# Patient Record
Sex: Female | Born: 1955 | Hispanic: No | Marital: Single | Smoking: Never smoker
Health system: Southern US, Community
[De-identification: ages and names within clinical notes are randomized; demographics above are authoritative.]

## PROBLEM LIST (undated history)

## (undated) DIAGNOSIS — K760 Fatty (change of) liver, not elsewhere classified: Secondary | ICD-10-CM

## (undated) DIAGNOSIS — M1990 Unspecified osteoarthritis, unspecified site: Secondary | ICD-10-CM

## (undated) DIAGNOSIS — F32A Depression, unspecified: Secondary | ICD-10-CM

## (undated) DIAGNOSIS — F419 Anxiety disorder, unspecified: Secondary | ICD-10-CM

## (undated) DIAGNOSIS — R918 Other nonspecific abnormal finding of lung field: Secondary | ICD-10-CM

## (undated) HISTORY — PX: FRACTURE SURGERY: SHX138

## (undated) HISTORY — PX: ABDOMINAL HYSTERECTOMY: SHX81

---

## 2020-09-24 ENCOUNTER — Other Ambulatory Visit: Payer: Self-pay | Admitting: Neurosurgery

## 2020-10-18 ENCOUNTER — Encounter (HOSPITAL_COMMUNITY): Payer: Self-pay

## 2020-10-18 ENCOUNTER — Other Ambulatory Visit: Payer: Self-pay

## 2020-10-18 ENCOUNTER — Encounter (HOSPITAL_COMMUNITY)
Admission: RE | Admit: 2020-10-18 | Discharge: 2020-10-18 | Disposition: A | Discharge status: Home / Self Care | Payer: Medicare Other | Source: Ambulatory Visit | Attending: Neurosurgery | Admitting: Neurosurgery

## 2020-10-18 ENCOUNTER — Other Ambulatory Visit (HOSPITAL_COMMUNITY)
Admission: RE | Admit: 2020-10-18 | Discharge: 2020-10-18 | Disposition: A | Discharge status: Home / Self Care | Payer: Medicare Other | Source: Ambulatory Visit | Attending: Neurosurgery | Admitting: Neurosurgery

## 2020-10-18 DIAGNOSIS — Z01812 Encounter for preprocedural laboratory examination: Secondary | ICD-10-CM | POA: Diagnosis present

## 2020-10-18 DIAGNOSIS — Z20822 Contact with and (suspected) exposure to covid-19: Secondary | ICD-10-CM | POA: Insufficient documentation

## 2020-10-18 HISTORY — DX: Unspecified osteoarthritis, unspecified site: M19.90

## 2020-10-18 HISTORY — DX: Anxiety disorder, unspecified: F41.9

## 2020-10-18 HISTORY — DX: Fatty (change of) liver, not elsewhere classified: K76.0

## 2020-10-18 HISTORY — DX: Depression, unspecified: F32.A

## 2020-10-18 HISTORY — DX: Other nonspecific abnormal finding of lung field: R91.8

## 2020-10-18 LAB — COMPREHENSIVE METABOLIC PANEL
ALT: 13 U/L (ref 0–44)
AST: 20 U/L (ref 15–41)
Albumin: 3.9 g/dL (ref 3.5–5.0)
Alkaline Phosphatase: 66 U/L (ref 38–126)
Anion gap: 8 (ref 5–15)
BUN: 10 mg/dL (ref 8–23)
CO2: 27 mmol/L (ref 22–32)
Calcium: 9.6 mg/dL (ref 8.9–10.3)
Chloride: 106 mmol/L (ref 98–111)
Creatinine, Ser: 0.74 mg/dL (ref 0.44–1.00)
GFR, Estimated: >60 mL/min (ref >60)
Glucose, Bld: 94 mg/dL (ref 70–99)
Potassium: 4.4 mmol/L (ref 3.5–5.1)
Sodium: 141 mmol/L (ref 135–145)
Total Bilirubin: 1.2 mg/dL (ref 0.3–1.2)
Total Protein: 6.2 g/dL — ABNORMAL LOW (ref 6.5–8.1)

## 2020-10-18 LAB — CBC
HCT: 40.2 % (ref 36.0–46.0)
Hemoglobin: 13.8 g/dL (ref 12.0–15.0)
MCH: 31.4 pg (ref 26.0–34.0)
MCHC: 34.3 g/dL (ref 30.0–36.0)
MCV: 91.6 fL (ref 80.0–100.0)
Platelets: 222 10*3/uL (ref 150–400)
RBC: 4.39 MIL/uL (ref 3.87–5.11)
RDW: 12.1 % (ref 11.5–15.5)
WBC: 5.1 10*3/uL (ref 4.0–10.5)
nRBC: 0 % (ref 0.0–0.2)

## 2020-10-18 LAB — TYPE AND SCREEN
ABO/RH(D): B POS
Antibody Screen: NEGATIVE

## 2020-10-18 LAB — SURGICAL PCR SCREEN
MRSA, PCR: NEGATIVE
Staphylococcus aureus: NEGATIVE

## 2020-10-18 LAB — SARS CORONAVIRUS 2 (TAT 6-24 HRS): SARS Coronavirus 2: NEGATIVE

## 2020-10-18 NOTE — Pre-Procedure Instructions (Signed)
Emily Gallagher  10/18/2020      Your procedure is scheduled on Monday, March 7.  Report to [facility], Main Entrance or Entrance "A" at 5:30 AM.                Your surgery or procedure is scheduled to begin at 7:30 AM   Call this number if you have problems the morning of surgery: [phone]  This is the number for the Pre- Surgical Desk.                For any other questions, please call [phone], Monday - Friday 8 AM - 4 PM.   Remember:  Do not eat or drink after midnight Sunday, March 6    Take these medicines the morning of surgery with A SIP OF WATER : DULoxetine (CYMBALTA)  estradiol (ESTRACE) gabapentin (NEURONTIN)  simvastatin (ZOCOR)  If needed: fluticasone (FLONASE)  nasal spray    STOP taking Aspirin, Aspirin Products (Goody Powder, Excedrin Migraine), Ibuprofen (Advil), Naproxen (Aleve), Vitamins and Herbal Products (ie Fish Oil), Pseudoephedrine-Ibuprofen (ADVIL COLD/SINUS).   Special instructions:  If you smoke , DO NOT smoke -within  24 hours prior to surgery.  If you have a CPAP, please bring the mask with you. - Preparing For Surgery  Before surgery, you can play an important role. Because skin is not sterile, your skin needs to be as free of germs as possible. You can reduce the number of germs on your skin by washing with CHG (chlorahexidine gluconate) Soap before surgery.  CHG is an antiseptic cleaner which kills germs and bonds with the skin to continue killing germs even after washing.    Oral Hygiene is also important to reduce your risk of infection.  Remember - BRUSH YOUR TEETH THE MORNING OF SURGERY WITH YOUR REGULAR TOOTHPASTE  Please do not use if you have an allergy to CHG or antibacterial soaps. If your skin becomes reddened/irritated stop using the CHG.  Do not shave (including legs and underarms) for at least 48 hours prior to first CHG shower. It is OK to shave your face.  Please follow these instructions  carefully.   1. Shower the NIGHT BEFORE SURGERY and the MORNING OF SURGERY with CHG.   2. If you chose to wash your hair, wash your hair first as usual with your normal shampoo.  3. After you shampoo, wash your face and private area with the soap you use at home, then rinse your hair and body thoroughly to remove the shampoo and soap.  4. Use CHG as you would any other liquid soap. You can apply CHG directly to the skin and wash gently with a scrungie or a clean washcloth.   5. Apply the CHG Soap to your body ONLY FROM THE NECK DOWN.  Do not use on open wounds or open sores. Avoid contact with your eyes, ears, mouth and genitals (private parts).   6. Wash thoroughly, paying special attention to the area where your surgery will be performed.  7. Thoroughly rinse your body with warm water from the neck down.  8. DO NOT shower/wash with your normal soap after using and rinsing off the CHG Soap.  9. Pat yourself dry with a CLEAN TOWEL.  10. Wear CLEAN PAJAMAS to bed the night before surgery, wear comfortable clothes the morning of surgery  11. Place CLEAN SHEETS on your bed the night of your first shower and DO NOT SLEEP WITH PETS.  Day of Surgery: Shower as instructed above. Do not apply any deodorants/lotions, powders or colognes.  Please wear clean clothes to the hospital/surgery center.   Remember to brush your teeth WITH YOUR REGULAR TOOTHPASTE.  Do not wear jewelry, make-up or nail polish.  Do not shave 48 hours prior to surgery.  Men may shave face and neck.  Do not bring valuables to the hospital.  [facility] is not responsible for any belongings or valuables.  Contacts, dentures or bridgework may not be worn into surgery.  Leave your suitcase in the car.  After surgery it may be brought to your room.  For patients admitted to the hospital, discharge time will be determined by your treatment team.  Patients discharged the day of surgery will not be allowed to drive home.    Please read over the fact sheets that you were given.

## 2020-10-18 NOTE — Progress Notes (Signed)
PCP: Lorelei Pont, MD Pulmonologist: Oda Cogan Cardiologist: Denies  EKG:na CXR: na ECHO: denies Stress Test: denies Cardiac Cath: denies  Fasting Blood Sugar- na Checks Blood Sugar_na__ times a day  ASA/Blood Thinners:  No  OSA/CPAP:  No  Covid test 10/18/20  Anesthesia Review:  No   Patient denies shortness of breath, fever, cough, and chest pain at PAT appointment.  Patient verbalized understanding of instructions provided today at the PAT appointment.  Patient asked to review instructions at home and day of surgery.

## 2020-10-18 NOTE — Progress Notes (Signed)
Sent Dr. Wynetta Emery a staff message

## 2020-10-22 ENCOUNTER — Inpatient Hospital Stay (HOSPITAL_COMMUNITY): Payer: Medicare Other

## 2020-10-22 ENCOUNTER — Encounter (HOSPITAL_COMMUNITY): Payer: Self-pay | Admitting: Neurosurgery

## 2020-10-22 ENCOUNTER — Inpatient Hospital Stay (HOSPITAL_COMMUNITY): Payer: Medicare Other | Admitting: Anesthesiology

## 2020-10-22 ENCOUNTER — Other Ambulatory Visit: Payer: Self-pay

## 2020-10-22 ENCOUNTER — Encounter (HOSPITAL_COMMUNITY)
Admission: RE | Disposition: A | Discharge status: Home Health | Payer: Self-pay | Source: Home / Self Care | Attending: Neurosurgery

## 2020-10-22 ENCOUNTER — Inpatient Hospital Stay (HOSPITAL_COMMUNITY)
Admission: RE | Admit: 2020-10-22 | Discharge: 2020-10-24 | DRG: 472 | Disposition: A | Discharge status: Home Health | Payer: Medicare Other | Attending: Neurosurgery | Admitting: Neurosurgery

## 2020-10-22 DIAGNOSIS — M4802 Spinal stenosis, cervical region: Secondary | ICD-10-CM | POA: Diagnosis present

## 2020-10-22 DIAGNOSIS — Z885 Allergy status to narcotic agent status: Secondary | ICD-10-CM

## 2020-10-22 DIAGNOSIS — Z90710 Acquired absence of both cervix and uterus: Secondary | ICD-10-CM

## 2020-10-22 DIAGNOSIS — M5412 Radiculopathy, cervical region: Secondary | ICD-10-CM | POA: Diagnosis present

## 2020-10-22 DIAGNOSIS — K760 Fatty (change of) liver, not elsewhere classified: Secondary | ICD-10-CM | POA: Diagnosis present

## 2020-10-22 DIAGNOSIS — F32A Depression, unspecified: Secondary | ICD-10-CM | POA: Diagnosis present

## 2020-10-22 DIAGNOSIS — R918 Other nonspecific abnormal finding of lung field: Secondary | ICD-10-CM | POA: Diagnosis present

## 2020-10-22 DIAGNOSIS — Z882 Allergy status to sulfonamides status: Secondary | ICD-10-CM | POA: Diagnosis not present

## 2020-10-22 DIAGNOSIS — Z79890 Hormone replacement therapy (postmenopausal): Secondary | ICD-10-CM

## 2020-10-22 DIAGNOSIS — F419 Anxiety disorder, unspecified: Secondary | ICD-10-CM | POA: Diagnosis present

## 2020-10-22 DIAGNOSIS — Z419 Encounter for procedure for purposes other than remedying health state, unspecified: Secondary | ICD-10-CM

## 2020-10-22 DIAGNOSIS — G992 Myelopathy in diseases classified elsewhere: Secondary | ICD-10-CM | POA: Diagnosis present

## 2020-10-22 DIAGNOSIS — Z79899 Other long term (current) drug therapy: Secondary | ICD-10-CM | POA: Diagnosis not present

## 2020-10-22 DIAGNOSIS — M1990 Unspecified osteoarthritis, unspecified site: Secondary | ICD-10-CM | POA: Diagnosis present

## 2020-10-22 HISTORY — PX: POSTERIOR CERVICAL FUSION/FORAMINOTOMY: SHX5038

## 2020-10-22 LAB — ABO/RH: ABO/RH(D): B POS

## 2020-10-22 SURGERY — POSTERIOR CERVICAL FUSION/FORAMINOTOMY LEVEL 4
Anesthesia: General

## 2020-10-22 MED ORDER — CYCLOBENZAPRINE HCL 10 MG PO TABS
10.0000 mg | ORAL_TABLET | Freq: Three times a day (TID) | ORAL | Status: DC | PRN
Start: 2020-10-22 — End: 2020-10-24
  Administered 2020-10-22 – 2020-10-23 (×4): 10 mg via ORAL
  Filled 2020-10-22 (×4): qty 1

## 2020-10-22 MED ORDER — THROMBIN 20000 UNITS EX SOLR
CUTANEOUS | Status: AC
  Filled 2020-10-22: qty 20000

## 2020-10-22 MED ORDER — BLACK ELDERBERRY 50 MG/5ML PO SYRP: 5.0000 mL | ORAL_SOLUTION | Freq: Every day | ORAL | Status: DC | PRN

## 2020-10-22 MED ORDER — ONDANSETRON HCL 4 MG/2ML IJ SOLN
INTRAMUSCULAR | Status: AC
  Filled 2020-10-22: qty 2

## 2020-10-22 MED ORDER — HYDROMORPHONE HCL 1 MG/ML IJ SOLN
INTRAMUSCULAR | Status: AC
  Administered 2020-10-22: 0.5 mg via INTRAVENOUS
  Filled 2020-10-22: qty 1

## 2020-10-22 MED ORDER — PROPOFOL 10 MG/ML IV BOLUS
INTRAVENOUS | Status: DC | PRN
  Administered 2020-10-22: 30 mg via INTRAVENOUS
  Administered 2020-10-22: 150 mg via INTRAVENOUS

## 2020-10-22 MED ORDER — PHENYLEPHRINE HCL-NACL 10-0.9 MG/250ML-% IV SOLN
INTRAVENOUS | Status: DC | PRN
  Administered 2020-10-22: 40 ug/min via INTRAVENOUS

## 2020-10-22 MED ORDER — EPHEDRINE 5 MG/ML INJ
INTRAVENOUS | Status: AC
  Filled 2020-10-22: qty 10

## 2020-10-22 MED ORDER — FLUTICASONE PROPIONATE 50 MCG/ACT NA SUSP
1.0000 | Freq: Every day | NASAL | Status: DC | PRN
  Filled 2020-10-22: qty 16

## 2020-10-22 MED ORDER — SODIUM CHLORIDE 0.9% FLUSH
3.0000 mL | Freq: Two times a day (BID) | INTRAVENOUS | Status: DC
  Administered 2020-10-22 (×2): 3 mL via INTRAVENOUS

## 2020-10-22 MED ORDER — FENTANYL CITRATE (PF) 250 MCG/5ML IJ SOLN
INTRAMUSCULAR | Status: AC
  Filled 2020-10-22: qty 5

## 2020-10-22 MED ORDER — BACITRACIN ZINC 500 UNIT/GM EX OINT
TOPICAL_OINTMENT | CUTANEOUS | Status: DC | PRN
  Administered 2020-10-22: 1 via TOPICAL

## 2020-10-22 MED ORDER — PROPOFOL 10 MG/ML IV BOLUS
INTRAVENOUS | Status: AC
  Filled 2020-10-22: qty 20

## 2020-10-22 MED ORDER — SODIUM CHLORIDE 0.9% FLUSH: 3.0000 mL | INTRAVENOUS | Status: DC | PRN

## 2020-10-22 MED ORDER — OXYCODONE HCL 5 MG PO TABS
5.0000 mg | ORAL_TABLET | ORAL | Status: DC | PRN
  Administered 2020-10-22: 5 mg via ORAL
  Filled 2020-10-22: qty 1

## 2020-10-22 MED ORDER — DULOXETINE HCL 30 MG PO CPEP
60.0000 mg | ORAL_CAPSULE | Freq: Every day | ORAL | Status: DC
Start: 2020-10-22 — End: 2020-10-24
  Administered 2020-10-22 – 2020-10-24 (×3): 60 mg via ORAL
  Filled 2020-10-22 (×3): qty 2

## 2020-10-22 MED ORDER — ROCURONIUM BROMIDE 10 MG/ML (PF) SYRINGE
PREFILLED_SYRINGE | INTRAVENOUS | Status: DC | PRN
  Administered 2020-10-22 (×2): 30 mg via INTRAVENOUS
  Administered 2020-10-22: 70 mg via INTRAVENOUS
  Administered 2020-10-22 (×2): 20 mg via INTRAVENOUS
  Administered 2020-10-22: 30 mg via INTRAVENOUS

## 2020-10-22 MED ORDER — ONDANSETRON HCL 4 MG/2ML IJ SOLN
INTRAMUSCULAR | Status: DC | PRN
  Administered 2020-10-22: 4 mg via INTRAVENOUS

## 2020-10-22 MED ORDER — ALUM & MAG HYDROXIDE-SIMETH 200-200-20 MG/5ML PO SUSP: 30.0000 mL | Freq: Four times a day (QID) | ORAL | Status: DC | PRN

## 2020-10-22 MED ORDER — CEFAZOLIN SODIUM-DEXTROSE 2-4 GM/100ML-% IV SOLN
2.0000 g | INTRAVENOUS | Status: AC
  Administered 2020-10-22: 2 g via INTRAVENOUS

## 2020-10-22 MED ORDER — ESTRADIOL 0.5 MG PO TABS: 0.2500 mg | ORAL_TABLET | Freq: Every day | ORAL | Status: DC

## 2020-10-22 MED ORDER — SUGAMMADEX SODIUM 200 MG/2ML IV SOLN
INTRAVENOUS | Status: DC | PRN
  Administered 2020-10-22: 200 mg via INTRAVENOUS

## 2020-10-22 MED ORDER — PANTOPRAZOLE SODIUM 40 MG IV SOLR: 40.0000 mg | Freq: Every day | INTRAVENOUS | Status: DC

## 2020-10-22 MED ORDER — LIDOCAINE 2% (20 MG/ML) 5 ML SYRINGE
INTRAMUSCULAR | Status: AC
  Filled 2020-10-22: qty 5

## 2020-10-22 MED ORDER — LIDOCAINE 2% (20 MG/ML) 5 ML SYRINGE
INTRAMUSCULAR | Status: DC | PRN
  Administered 2020-10-22: 100 mg via INTRAVENOUS

## 2020-10-22 MED ORDER — HYDROCODONE-ACETAMINOPHEN 5-325 MG PO TABS
1.0000 | ORAL_TABLET | ORAL | Status: DC | PRN
  Administered 2020-10-22 – 2020-10-24 (×9): 1 via ORAL
  Filled 2020-10-22 (×9): qty 1

## 2020-10-22 MED ORDER — LORAZEPAM 0.5 MG PO TABS
2.5000 mg | ORAL_TABLET | Freq: Every day | ORAL | Status: DC
  Administered 2020-10-22 – 2020-10-23 (×2): 2.5 mg via ORAL
  Filled 2020-10-22 (×2): qty 5

## 2020-10-22 MED ORDER — LACTATED RINGERS IV SOLN: INTRAVENOUS | Status: DC | PRN

## 2020-10-22 MED ORDER — PHENOL 1.4 % MT LIQD: 1.0000 | OROMUCOSAL | Status: DC | PRN

## 2020-10-22 MED ORDER — OXYCODONE HCL 5 MG PO TABS
5.0000 mg | ORAL_TABLET | ORAL | Status: DC | PRN
  Filled 2020-10-22: qty 2

## 2020-10-22 MED ORDER — MILK THISTLE 175 MG PO TABS: 175.0000 mg | ORAL_TABLET | Freq: Three times a day (TID) | ORAL | Status: DC

## 2020-10-22 MED ORDER — GABAPENTIN 300 MG PO CAPS
300.0000 mg | ORAL_CAPSULE | Freq: Two times a day (BID) | ORAL | Status: DC
  Administered 2020-10-22 (×2): 300 mg via ORAL
  Filled 2020-10-22 (×2): qty 1

## 2020-10-22 MED ORDER — PSEUDOEPHEDRINE-IBUPROFEN 30-200 MG PO TABS: 1.0000 | ORAL_TABLET | Freq: Every day | ORAL | Status: DC | PRN

## 2020-10-22 MED ORDER — THROMBIN 5000 UNITS EX SOLR
OROMUCOSAL | Status: DC | PRN
  Administered 2020-10-22: 5 mL via TOPICAL

## 2020-10-22 MED ORDER — OXYCODONE HCL 5 MG PO TABS: 5.0000 mg | ORAL_TABLET | ORAL | Status: DC | PRN

## 2020-10-22 MED ORDER — FENTANYL CITRATE (PF) 250 MCG/5ML IJ SOLN
INTRAMUSCULAR | Status: DC | PRN
  Administered 2020-10-22: 25 ug via INTRAVENOUS
  Administered 2020-10-22 (×2): 50 ug via INTRAVENOUS

## 2020-10-22 MED ORDER — SODIUM CHLORIDE 0.9 % IV SOLN
250.0000 mL | INTRAVENOUS | Status: DC
  Administered 2020-10-22: 250 mL via INTRAVENOUS

## 2020-10-22 MED ORDER — HYDROMORPHONE HCL 1 MG/ML IJ SOLN: 0.5000 mg | INTRAMUSCULAR | Status: DC | PRN

## 2020-10-22 MED ORDER — DEXAMETHASONE SODIUM PHOSPHATE 4 MG/ML IJ SOLN
4.0000 mg | Freq: Four times a day (QID) | INTRAMUSCULAR | Status: DC
  Administered 2020-10-22 – 2020-10-23 (×4): 4 mg via INTRAVENOUS
  Filled 2020-10-22 (×4): qty 1

## 2020-10-22 MED ORDER — LIDOCAINE-EPINEPHRINE 1 %-1:100000 IJ SOLN
INTRAMUSCULAR | Status: DC | PRN
  Administered 2020-10-22: 9 mL

## 2020-10-22 MED ORDER — ROCURONIUM BROMIDE 10 MG/ML (PF) SYRINGE
PREFILLED_SYRINGE | INTRAVENOUS | Status: AC
  Filled 2020-10-22: qty 20

## 2020-10-22 MED ORDER — ONDANSETRON HCL 4 MG/2ML IJ SOLN: 4.0000 mg | Freq: Four times a day (QID) | INTRAMUSCULAR | Status: DC | PRN

## 2020-10-22 MED ORDER — ACETAMINOPHEN 10 MG/ML IV SOLN
INTRAVENOUS | Status: AC
  Administered 2020-10-22: 1000 mg via INTRAVENOUS
  Filled 2020-10-22: qty 100

## 2020-10-22 MED ORDER — 0.9 % SODIUM CHLORIDE (POUR BTL) OPTIME
TOPICAL | Status: DC | PRN
  Administered 2020-10-22: 1000 mL

## 2020-10-22 MED ORDER — HYDROMORPHONE HCL 1 MG/ML IJ SOLN
0.2500 mg | INTRAMUSCULAR | Status: DC | PRN
  Administered 2020-10-22 (×3): 0.25 mg via INTRAVENOUS

## 2020-10-22 MED ORDER — CHLORHEXIDINE GLUCONATE 0.12 % MT SOLN
OROMUCOSAL | Status: AC
  Administered 2020-10-22: 15 mL
  Filled 2020-10-22: qty 15

## 2020-10-22 MED ORDER — ACETAMINOPHEN 10 MG/ML IV SOLN: 1000.0000 mg | Freq: Once | INTRAVENOUS | Status: DC | PRN

## 2020-10-22 MED ORDER — ONDANSETRON HCL 4 MG/2ML IJ SOLN
4.0000 mg | Freq: Once | INTRAMUSCULAR | Status: AC | PRN
  Administered 2020-10-22: 4 mg via INTRAVENOUS

## 2020-10-22 MED ORDER — LIDOCAINE-EPINEPHRINE 1 %-1:100000 IJ SOLN
INTRAMUSCULAR | Status: AC
  Filled 2020-10-22: qty 1

## 2020-10-22 MED ORDER — MIDAZOLAM HCL 2 MG/2ML IJ SOLN
INTRAMUSCULAR | Status: AC
  Filled 2020-10-22: qty 2

## 2020-10-22 MED ORDER — SIMVASTATIN 20 MG PO TABS
20.0000 mg | ORAL_TABLET | Freq: Every day | ORAL | Status: DC
  Administered 2020-10-22 – 2020-10-24 (×3): 20 mg via ORAL
  Filled 2020-10-22 (×3): qty 1

## 2020-10-22 MED ORDER — KETAMINE HCL 50 MG/5ML IJ SOSY
PREFILLED_SYRINGE | INTRAMUSCULAR | Status: AC
  Filled 2020-10-22: qty 5

## 2020-10-22 MED ORDER — ACETAMINOPHEN 325 MG PO TABS
650.0000 mg | ORAL_TABLET | ORAL | Status: DC | PRN
  Filled 2020-10-22: qty 2

## 2020-10-22 MED ORDER — PANTOPRAZOLE SODIUM 40 MG PO TBEC
40.0000 mg | DELAYED_RELEASE_TABLET | Freq: Every day | ORAL | Status: DC
  Administered 2020-10-22 – 2020-10-24 (×3): 40 mg via ORAL
  Filled 2020-10-22 (×3): qty 1

## 2020-10-22 MED ORDER — DEXAMETHASONE SODIUM PHOSPHATE 10 MG/ML IJ SOLN: 10.0000 mg | Freq: Once | INTRAMUSCULAR | Status: DC

## 2020-10-22 MED ORDER — ACETAMINOPHEN 650 MG RE SUPP: 650.0000 mg | RECTAL | Status: DC | PRN

## 2020-10-22 MED ORDER — DEXAMETHASONE 4 MG PO TABS
4.0000 mg | ORAL_TABLET | Freq: Four times a day (QID) | ORAL | Status: DC
  Administered 2020-10-23 – 2020-10-24 (×5): 4 mg via ORAL
  Filled 2020-10-22 (×5): qty 1

## 2020-10-22 MED ORDER — EPHEDRINE SULFATE-NACL 50-0.9 MG/10ML-% IV SOSY
PREFILLED_SYRINGE | INTRAVENOUS | Status: DC | PRN
  Administered 2020-10-22: 5 mg via INTRAVENOUS
  Administered 2020-10-22 (×2): 10 mg via INTRAVENOUS

## 2020-10-22 MED ORDER — BUPIVACAINE HCL (PF) 0.25 % IJ SOLN
INTRAMUSCULAR | Status: AC
  Filled 2020-10-22: qty 30

## 2020-10-22 MED ORDER — CEFAZOLIN SODIUM-DEXTROSE 2-4 GM/100ML-% IV SOLN
2.0000 g | Freq: Three times a day (TID) | INTRAVENOUS | Status: AC
  Administered 2020-10-22 (×2): 2 g via INTRAVENOUS
  Filled 2020-10-22 (×2): qty 100

## 2020-10-22 MED ORDER — THROMBIN 20000 UNITS EX SOLR
CUTANEOUS | Status: DC | PRN
  Administered 2020-10-22: 20 mL via TOPICAL

## 2020-10-22 MED ORDER — CHLORHEXIDINE GLUCONATE CLOTH 2 % EX PADS: 6.0000 | MEDICATED_PAD | Freq: Once | CUTANEOUS | Status: DC

## 2020-10-22 MED ORDER — ONDANSETRON HCL 4 MG PO TABS: 4.0000 mg | ORAL_TABLET | Freq: Four times a day (QID) | ORAL | Status: DC | PRN

## 2020-10-22 MED ORDER — DEXAMETHASONE SODIUM PHOSPHATE 10 MG/ML IJ SOLN
INTRAMUSCULAR | Status: DC | PRN
  Administered 2020-10-22: 10 mg via INTRAVENOUS

## 2020-10-22 MED ORDER — MENTHOL 3 MG MT LOZG: 1.0000 | LOZENGE | OROMUCOSAL | Status: DC | PRN

## 2020-10-22 MED ORDER — THROMBIN (RECOMBINANT) 5000 UNITS EX SOLR
CUTANEOUS | Status: AC
  Filled 2020-10-22: qty 5000

## 2020-10-22 MED ORDER — BACITRACIN ZINC 500 UNIT/GM EX OINT
TOPICAL_OINTMENT | CUTANEOUS | Status: AC
  Filled 2020-10-22: qty 28.35

## 2020-10-22 MED ORDER — BUPIVACAINE HCL (PF) 0.25 % IJ SOLN
INTRAMUSCULAR | Status: DC | PRN
  Administered 2020-10-22: 10 mL

## 2020-10-22 MED ORDER — DEXAMETHASONE SODIUM PHOSPHATE 10 MG/ML IJ SOLN
INTRAMUSCULAR | Status: AC
  Filled 2020-10-22: qty 1

## 2020-10-22 MED ORDER — HYDROMORPHONE HCL 1 MG/ML IJ SOLN
INTRAMUSCULAR | Status: AC
  Administered 2020-10-22: 0.25 mg via INTRAVENOUS
  Filled 2020-10-22: qty 1

## 2020-10-22 SURGICAL SUPPLY — 67 items
BAND RUBBER #18 3X1/16 STRL (MISCELLANEOUS) IMPLANT
BENZOIN TINCTURE PRP APPL 2/3 (GAUZE/BANDAGES/DRESSINGS) ×2 IMPLANT
BIT DRILL 3.5 SHORT (BIT) ×2 IMPLANT
BIT DRILL NEURO 2X3.1 SFT TUCH (MISCELLANEOUS) ×1 IMPLANT
BLADE CLIPPER SURG (BLADE) ×2 IMPLANT
BLADE SURG 11 STRL SS (BLADE) ×2 IMPLANT
BONE VIVIGEN FORMABLE 5.4CC (Bone Implant) ×2 IMPLANT
BUR MATCHSTICK NEURO 3.0 LAGG (BURR) ×2 IMPLANT
CANISTER SUCT 3000ML PPV (MISCELLANEOUS) ×2 IMPLANT
CAP LOCKING (Cap) ×10 IMPLANT
CARTRIDGE OIL MAESTRO DRILL (MISCELLANEOUS) ×1 IMPLANT
COVER WAND RF STERILE (DRAPES) IMPLANT
DECANTER SPIKE VIAL GLASS SM (MISCELLANEOUS) ×2 IMPLANT
DERMABOND ADVANCED (GAUZE/BANDAGES/DRESSINGS) ×1
DERMABOND ADVANCED .7 DNX12 (GAUZE/BANDAGES/DRESSINGS) ×1 IMPLANT
DIFFUSER DRILL AIR PNEUMATIC (MISCELLANEOUS) ×2 IMPLANT
DRAPE C-ARM 42X72 X-RAY (DRAPES) IMPLANT
DRAPE LAPAROTOMY 100X72 PEDS (DRAPES) ×2 IMPLANT
DRAPE MICROSCOPE LEICA (MISCELLANEOUS) IMPLANT
DRAPE SURG 17X23 STRL (DRAPES) ×8 IMPLANT
DRILL NEURO 2X3.1 SOFT TOUCH (MISCELLANEOUS) ×2
DRSG OPSITE 4X5.5 SM (GAUZE/BANDAGES/DRESSINGS) ×2 IMPLANT
DRSG OPSITE POSTOP 4X6 (GAUZE/BANDAGES/DRESSINGS) ×2 IMPLANT
DURAPREP 26ML APPLICATOR (WOUND CARE) ×2 IMPLANT
ELECT REM PT RETURN 9FT ADLT (ELECTROSURGICAL) ×2
ELECTRODE REM PT RTRN 9FT ADLT (ELECTROSURGICAL) ×1 IMPLANT
EVACUATOR 1/8 PVC DRAIN (DRAIN) ×2 IMPLANT
GAUZE 4X4 16PLY RFD (DISPOSABLE) IMPLANT
GAUZE SPONGE 4X4 12PLY STRL (GAUZE/BANDAGES/DRESSINGS) ×2 IMPLANT
GAUZE SPONGE 4X4 12PLY STRL LF (GAUZE/BANDAGES/DRESSINGS) ×2 IMPLANT
GLOVE BIO SURGEON STRL SZ7 (GLOVE) IMPLANT
GLOVE BIO SURGEON STRL SZ8 (GLOVE) ×2 IMPLANT
GLOVE EXAM NITRILE XL STR (GLOVE) IMPLANT
GLOVE INDICATOR 8.5 STRL (GLOVE) ×2 IMPLANT
GLOVE SURG UNDER POLY LF SZ7 (GLOVE) IMPLANT
GOWN STRL REUS W/ TWL LRG LVL3 (GOWN DISPOSABLE) IMPLANT
GOWN STRL REUS W/ TWL XL LVL3 (GOWN DISPOSABLE) ×1 IMPLANT
GOWN STRL REUS W/TWL 2XL LVL3 (GOWN DISPOSABLE) IMPLANT
GOWN STRL REUS W/TWL LRG LVL3 (GOWN DISPOSABLE)
GOWN STRL REUS W/TWL XL LVL3 (GOWN DISPOSABLE) ×1
HEMOSTAT POWDER KIT SURGIFOAM (HEMOSTASIS) ×2 IMPLANT
IMPL QUARTEX 3.5X12MM (Neuro Prosthesis/Implant) ×10 IMPLANT
IMPLANT QUARTEX 3.5X12MM (Neuro Prosthesis/Implant) ×20 IMPLANT
KIT BASIN OR (CUSTOM PROCEDURE TRAY) ×2 IMPLANT
KIT INFUSE SMALL (Orthopedic Implant) ×2 IMPLANT
KIT TURNOVER KIT B (KITS) ×2 IMPLANT
LOCKING CAP (Cap) ×20 IMPLANT
MARKER SKIN DUAL TIP RULER LAB (MISCELLANEOUS) ×2 IMPLANT
NEEDLE HYPO 25X1 1.5 SAFETY (NEEDLE) ×2 IMPLANT
NEEDLE SPNL 20GX3.5 QUINCKE YW (NEEDLE) ×2 IMPLANT
NS IRRIG 1000ML POUR BTL (IV SOLUTION) ×2 IMPLANT
OIL CARTRIDGE MAESTRO DRILL (MISCELLANEOUS) ×2
PACK LAMINECTOMY NEURO (CUSTOM PROCEDURE TRAY) ×2 IMPLANT
PAD ARMBOARD 7.5X6 YLW CONV (MISCELLANEOUS) ×6 IMPLANT
PIN MAYFIELD SKULL DISP (PIN) ×2 IMPLANT
ROD CURVED CAPITOL 3.5X65 (Rod) ×4 IMPLANT
SPONGE LAP 4X18 RFD (DISPOSABLE) IMPLANT
SPONGE SURGIFOAM ABS GEL 100 (HEMOSTASIS) ×2 IMPLANT
STRIP CLOSURE SKIN 1/2X4 (GAUZE/BANDAGES/DRESSINGS) ×2 IMPLANT
SUT VIC AB 0 CT1 18XCR BRD8 (SUTURE) ×1 IMPLANT
SUT VIC AB 0 CT1 8-18 (SUTURE) ×1
SUT VIC AB 2-0 CT1 18 (SUTURE) ×4 IMPLANT
SUT VIC AB 4-0 PS2 27 (SUTURE) ×2 IMPLANT
TOWEL GREEN STERILE (TOWEL DISPOSABLE) ×2 IMPLANT
TOWEL GREEN STERILE FF (TOWEL DISPOSABLE) ×2 IMPLANT
TRAY FOLEY MTR SLVR 16FR STAT (SET/KITS/TRAYS/PACK) ×2 IMPLANT
WATER STERILE IRR 1000ML POUR (IV SOLUTION) ×2 IMPLANT

## 2020-10-22 NOTE — Transfer of Care (Signed)
Immediate Anesthesia Transfer of Care Note  Patient: Emily Gallagher  Procedure(s) Performed: Posterior Cervical Laminectomy - Cervical Three-Cervical Four - Cervical Four-Cervical Five - Cerivcal Five-Cervical Six - Cervical Six-Cervical Seven and Posterolateral fusion with lateral mass screws (N/A )  Patient Location: PACU  Anesthesia Type:General  Level of Consciousness: drowsy and patient cooperative  Airway & Oxygen Therapy: Patient Spontanous Breathing and Patient connected to face mask oxygen  Post-op Assessment: Report given to RN and Post -op Vital signs reviewed and stable  Post vital signs: Reviewed and stable  Last Vitals:  Vitals Value Taken Time  BP 113/42 10/22/20 1057  Temp 97.2   Pulse 81 10/22/20 1057  Resp 18 10/22/20 1057  SpO2 100 % 10/22/20 1057  Vitals shown include unvalidated device data.  Last Pain:  Vitals:   10/22/20 0623  TempSrc: Oral  PainSc: 0-No pain         Complications: No complications documented.

## 2020-10-22 NOTE — Anesthesia Postprocedure Evaluation (Signed)
Anesthesia Post Note  Patient: Emily Gallagher  Procedure(s) Performed: Posterior Cervical Laminectomy - Cervical Three-Cervical Four - Cervical Four-Cervical Five - Cerivcal Five-Cervical Six - Cervical Six-Cervical Seven and Posterolateral fusion with lateral mass screws (N/A )     Patient location during evaluation: PACU Anesthesia Type: General Level of consciousness: awake and alert Pain management: pain level controlled Vital Signs Assessment: post-procedure vital signs reviewed and stable Respiratory status: spontaneous breathing, nonlabored ventilation, respiratory function stable and patient connected to nasal cannula oxygen Cardiovascular status: blood pressure returned to baseline and stable Postop Assessment: no apparent nausea or vomiting Anesthetic complications: no   No complications documented.  Last Vitals:  Vitals:   10/22/20 1213 10/22/20 1229  BP:  (!) 124/58  Pulse:  91  Resp:  18  Temp: 36.4 C 36.6 C  SpO2:  96%    Last Pain:  Vitals:   10/22/20 1200  TempSrc:   PainSc: 5                  Jerika Wales S

## 2020-10-22 NOTE — Anesthesia Procedure Notes (Signed)
Procedure Name: Intubation Date/Time: 10/22/2020 7:40 AM Performed by: Carlos American, CRNA Pre-anesthesia Checklist: Patient identified, Emergency Drugs available, Suction available and Patient being monitored Patient Re-evaluated:Patient Re-evaluated prior to induction Oxygen Delivery Method: Circle System Utilized Preoxygenation: Pre-oxygenation with 100% oxygen Induction Type: IV induction Ventilation: Mask ventilation without difficulty Laryngoscope Size: Glidescope and 3 Grade View: Grade I Tube type: Oral Tube size: 7.0 mm Number of attempts: 1 Airway Equipment and Method: Stylet and Oral airway Placement Confirmation: ETT inserted through vocal cords under direct vision,  positive ETCO2 and breath sounds checked- equal and bilateral Secured at: 23 cm Tube secured with: Tape Dental Injury: Teeth and Oropharynx as per pre-operative assessment

## 2020-10-22 NOTE — Anesthesia Preprocedure Evaluation (Signed)
Anesthesia Evaluation  Patient identified by MRN, date of birth, ID band Patient awake    Reviewed: Allergy & Precautions, H&P , NPO status , Patient's Chart, lab work & pertinent test results  Airway Mallampati: II  TM Distance: >3 FB Neck ROM: Limited    Dental no notable dental hx.    Pulmonary neg pulmonary ROS,    Pulmonary exam normal breath sounds clear to auscultation       Cardiovascular negative cardio ROS Normal cardiovascular exam Rhythm:Regular Rate:Normal     Neuro/Psych negative neurological ROS     GI/Hepatic negative GI ROS, Neg liver ROS,   Endo/Other  negative endocrine ROS  Renal/GU negative Renal ROS  negative genitourinary   Musculoskeletal  (+) Arthritis , Osteoarthritis,    Abdominal   Peds negative pediatric ROS (+)  Hematology negative hematology ROS (+)   Anesthesia Other Findings   Reproductive/Obstetrics negative OB ROS                             Anesthesia Physical Anesthesia Plan  ASA: II  Anesthesia Plan: General   Post-op Pain Management:    Induction: Intravenous  PONV Risk Score and Plan: 3 and Ondansetron, Dexamethasone and Treatment may vary due to age or medical condition  Airway Management Planned: Oral ETT and Video Laryngoscope Planned  Additional Equipment:   Intra-op Plan:   Post-operative Plan: Extubation in OR  Informed Consent: I have reviewed the patients History and Physical, chart, labs and discussed the procedure including the risks, benefits and alternatives for the proposed anesthesia with the patient or authorized representative who has indicated his/her understanding and acceptance.     Dental advisory given  Plan Discussed with: CRNA and Surgeon  Anesthesia Plan Comments:         Anesthesia Quick Evaluation

## 2020-10-22 NOTE — Progress Notes (Signed)
Orthopedic Tech Progress Note Patient Details:  Evaleigh Mccamy [place] 1956/01/18 [id number] PACU RN called requesting a SOFT COLLAR  Ortho Devices Type of Ortho Device: Soft collar Ortho Device/Splint Location: NECK Ortho Device/Splint Interventions: Ordered,Application   Post Interventions Patient Tolerated: Well Instructions Provided: Care of device   Donald Pore 10/22/2020, 1:10 PM

## 2020-10-22 NOTE — Progress Notes (Signed)
PHARMACIST - PHYSICIAN ORDER COMMUNICATION  CONCERNING: P&T Medication Policy on Herbal Medications  DESCRIPTION:  This patient's order for:  Black elderberry and milk thistle  has been noted.  This product(s) is classified as an "herbal" or natural product. Due to a lack of definitive safety studies or FDA approval, nonstandard manufacturing practices, plus the potential risk of unknown drug-drug interactions while on inpatient medications, the Pharmacy and Therapeutics Committee does not permit the use of "herbal" or natural products of this type within [facility].   ACTION TAKEN: The pharmacy department is unable to verify this order at this time and your patient has been informed of this safety policy. Please reevaluate patient's clinical condition at discharge and address if the herbal or natural product(s) should be resumed at that time.  Alphia Moh, PharmD, BCPS, BCCP Clinical Pharmacist  Please check AMION for all [facility] phone numbers After 10:00 PM, call Main Pharmacy [phone]

## 2020-10-22 NOTE — H&P (Signed)
Emily Gallagher is an 65 y.o. female.   Chief Complaint: Neck pain bilateral shoulder arm pain numbness tingling weakness difficulty walking HPI: 65 year old with progressive worsening numbness [place]ley weakness in hands difficulty walking consistent with a cervical myelopathy work-up revealed severe spinal cord compression in her cervical spine from C3 down to C7.  Due to patient's progression of clinical syndrome imaging findings and failed conservative treatment I recommended posterior cervical decompressive laminectomy and lateral mass screw fixation from C3-C7.  I extensively went over the risks and benefits of that operation with her as well as perioperative course expectations of outcome and alternatives of surgery and she understands and agrees to proceed forward.  Past Medical History:  Diagnosis Date  . Anxiety   . Arthritis   . Depression   . Fatty liver   . Lung nodules     Past Surgical History:  Procedure Laterality Date  . ABDOMINAL HYSTERECTOMY    . FRACTURE SURGERY      History reviewed. No pertinent family history. Social History:  reports that she has never smoked. She has never used smokeless tobacco. She reports previous alcohol use. She reports that she does not use drugs.  Allergies:  Allergies  Allergen Reactions  . Codeine Itching  . Sulfa Antibiotics Swelling    Medications Prior to Admission  Medication Sig Dispense Refill  . Black Elderberry 50 MG/5ML SYRP Take 5 mLs by mouth daily as needed (Immunity).    . DULoxetine (CYMBALTA) 60 MG capsule Take 60 mg by mouth daily.    Marland Kitchen estradiol (ESTRACE) 0.5 MG tablet Take 0.25 mg by mouth daily.    . fluticasone (FLONASE) 50 MCG/ACT nasal spray Place 1 spray into both nostrils daily as needed for allergies or rhinitis.    Marland Kitchen gabapentin (NEURONTIN) 300 MG capsule Take 300 mg by mouth 2 (two) times daily.    . Homeopathic Products (ZICAM COLD REMEDY NA) Place 1 application into the nose daily as needed (cold  symptoms).    . LORazepam (ATIVAN) 1 MG tablet Take 2.5 mg by mouth at bedtime.    . milk thistle 175 MG tablet Take 175 mg by mouth 3 (three) times daily with meals.    Marland Kitchen omeprazole (PRILOSEC) 20 MG capsule Take 20 mg by mouth daily as needed (heartburn).    . Pseudoephedrine-Ibuprofen (ADVIL COLD/SINUS) 30-200 MG TABS Take 1 tablet by mouth daily as needed (sinus issues).    . simvastatin (ZOCOR) 20 MG tablet Take 20 mg by mouth daily.      Results for orders placed or performed during the hospital encounter of 10/22/20 (from the past 48 hour(s))  ABO/Rh     Status: None (Preliminary result)   Collection Time: 10/22/20  7:13 AM  Result Value Ref Range   ABO/RH(D) PENDING    No results found.  Review of Systems  Neurological: Positive for weakness and numbness.    Blood pressure (!) 128/55, pulse 79, temperature 97.9 F (36.6 C), temperature source Oral, resp. rate 18, height 5\' 6"  (1.676 m), weight 63.6 kg, SpO2 99 %. Physical Exam HENT:     Head: Normocephalic.     Right Ear: Tympanic membrane normal.     Nose: Nose normal.     Mouth/Throat:     Mouth: Mucous membranes are moist.  Eyes:     Pupils: Pupils are equal, round, and reactive to light.  Cardiovascular:     Rate and Rhythm: Normal rate.     Pulses: Normal pulses.  Pulmonary:     Effort: Pulmonary effort is normal.  Abdominal:     General: Bowel sounds are normal.  Musculoskeletal:        General: Normal range of motion.  Skin:    General: Skin is warm.     Capillary Refill: Capillary refill takes less than 2 seconds.  Neurological:     Mental Status: She is alert.     Cranial Nerves: Cranial nerves are intact.     Comments: Patient is awake and alert strength is 5-5 deltoid, bicep, tricep, wrist flexion, wrist extension, hand intrinsics.      Assessment/Plan 64-year presents for posterior cervical decompressive laminectomy and lateral mass fusion with screws and rods.  Mariam Dollar, MD 10/22/2020, 7:24  AM

## 2020-10-22 NOTE — Op Note (Signed)
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  Postoperative diagnosis: Same  Procedure: #1 decompressive cervical laminectomy at C3-4, C4-5, C5-6, and C6-7 with partial medial facetectomies and foraminotomies of the C4, C5, C6, and C7 nerve roots  2.  Lateral mass fixation C3-4, C4-5, C5-6, C6-7 utilizing the globus ellipse lateral mass screw system  3.  Posterior lateral arthrodesis C3-4 C4-5 C5-6 C6-7 utilizing locally harvested autograft mixed with vivigen and BMP  Surgeon: Jillyn Hidden cram  Assistant: Julien Girt  Anesthesia: General  EBL: Minimal  HPI: 65 year old female with progressive worsening numbness tingling weakness in her hands difficulty walking clinical exam consistent with myelopathy patient failed all forms of conservative treatment and due to her progression of clinical syndrome imaging findings and failed conservative treatment recommended decompression stabilization procedure from C3-C7.  I extensively went over the risks and benefits of that operation with her as well as perioperative course expectations of outcome and alternatives of surgery and she understood and agreed to proceed forward.  Operative procedure: Patient brought into the OR reduced under general anesthesia positioned prone in pins the backside of her head neck was shaved prepped and draped in routine sterile fashion after infiltration of 10 cc lidocaine with epi midline incision was made and Bovie letter cautery was used take down the subcutaneous tissue and subperiosteal dissection was carried down the lamina from C2 down to T1 I exposed the lateral masses from C3-C7.  Intraoperative x-ray confirmed defecation appropriate level so then I proceeded to place the lateral mass screws on the patient's right side and used created pilot holes in the inferomedial quadrant of the lateral masses of C3, C4, C5, C6 and C7  then drilled 12 mm holes tapped the neocortex and placed 5 screws all screws had excellent purchase.  I elected not to initially place the left-sided screws to allow me to work around the decompression.  At this point I remove the spinous processes at C3, C4, C5, C6, and C7 drilled down the lateral lamina and gutter from C3 down through 7 and then with a 2 mm goal Kerrison marched up the lateral gutter on each side of the lamina removing the central lamina en bloc to minimize any manipulation of the spinal cord.  Perform this throughout the entirety of the decompression and then marched along the lateral gutters under biting the medial facet and performing foraminotomies of the C4, C5, C6, and C7 nerve roots at the end decompression was no further stenosis either centrally or foraminally I then drew the drill the pilot holes in place the left-sided lateral mass screws contoured to rods anchored everything in place placed Gelfoam along the dura copiously irrigated the wound and then aggressively decorticated the lateral masses and the facet joints at C3-4 C4-5 C5-6 and C6-7.  Packed an extensive mount of autograft mix and BMP inside the facet joints and along the lateral masses from C3-C7.  Then placed a medium Hemovac drain closed the wound in layers with interrupted Vicryl running 4 subcuticular and skin Dermabond benzoin Steri-Strips and a sterile dressing.  At at the end the case all needle counts and sponge counts were correct.  Postop fluoroscopy also confirmed good position of all the implants.

## 2020-10-23 ENCOUNTER — Encounter (HOSPITAL_COMMUNITY): Payer: Self-pay | Admitting: Neurosurgery

## 2020-10-23 MED ORDER — GABAPENTIN 300 MG PO CAPS
300.0000 mg | ORAL_CAPSULE | Freq: Three times a day (TID) | ORAL | Status: DC
  Administered 2020-10-23 – 2020-10-24 (×4): 300 mg via ORAL
  Filled 2020-10-23 (×4): qty 1

## 2020-10-23 NOTE — Progress Notes (Signed)
Subjective: Patient reports pain in her pointer and thumb fingers that has been there since last night   Objective: Vital signs in last 24 hours: Temp:  [97.2 F (36.2 C)-98.6 F (37 C)] 98.6 F (37 C) (03/08 0727) Pulse Rate:  [75-97] 92 (03/08 0727) Resp:  [13-19] 18 (03/08 0727) BP: (91-131)/(42-65) 131/55 (03/08 0727) SpO2:  [94 %-100 %] 97 % (03/08 0727)  Intake/Output from previous day: 03/07 0701 - 03/08 0700 In: 1440 [P.O.:240; I.V.:1200] Out: 2670 [Urine:2350; Drains:120; Blood:200] Intake/Output this shift: No intake/output data recorded.  Neurologic: Grossly normal  Lab Results: Lab Results  Component Value Date   WBC 5.1 10/18/2020   HGB 13.8 10/18/2020   HCT 40.2 10/18/2020   MCV 91.6 10/18/2020   PLT 222 10/18/2020   No results found for: INR, PROTIME BMET Lab Results  Component Value Date   NA 141 10/18/2020   K 4.4 10/18/2020   CL 106 10/18/2020   CO2 27 10/18/2020   GLUCOSE 94 10/18/2020   BUN 10 10/18/2020   CREATININE 0.74 10/18/2020   CALCIUM 9.6 10/18/2020    Studies/Results: DG Cervical Spine 2-3 Views  Result Date: 10/22/2020 CLINICAL DATA:  Posterior fusion C3 through C7 EXAM: CERVICAL SPINE - 2-3 VIEW; DG C-ARM 1-60 MIN COMPARISON:  Cervical radiographs 06/19/2020 FINDINGS: AP and lateral C-arm images were obtained the operating room. Sponge is seen in the posterior soft tissues. Posterior screw placement C3 through C7 bilaterally. Posterior rods not in place at this time. IMPRESSION: Posterior screw placement bilaterally C3 through C7. Electronically Signed   By: Marlan Palau M.D.   On: 10/22/2020 10:07   DG C-Arm 1-60 Min  Result Date: 10/22/2020 CLINICAL DATA:  Posterior fusion C3 through C7 EXAM: CERVICAL SPINE - 2-3 VIEW; DG C-ARM 1-60 MIN COMPARISON:  Cervical radiographs 06/19/2020 FINDINGS: AP and lateral C-arm images were obtained the operating room. Sponge is seen in the posterior soft tissues. Posterior screw placement C3  through C7 bilaterally. Posterior rods not in place at this time. IMPRESSION: Posterior screw placement bilaterally C3 through C7. Electronically Signed   By: Marlan Palau M.D.   On: 10/22/2020 10:07    Assessment/Plan: Postop day 1 posterior cervical fusion and decompression C3-C7. Continue therapy today. Will increase her gabapentin to see if this helps with the pain in her fingers. No new weakness N or T.    LOS: 1 day    Emily Gallagher 10/23/2020, 7:46 AM

## 2020-10-23 NOTE — Evaluation (Addendum)
Physical Therapy Evaluation Patient Details Name: Emily Gallagher MRN: [id number] DOB: [birth year] Today's Date: 10/23/2020   History of Present Illness  Pt is a 65 y/o female who presents s/p C3-C7 posterior cervical fusion on 10/22/2020. PMH significant for depression, lung nodules.  Clinical Impression  Pt admitted with above diagnosis. At the time of PT eval, pt was able to demonstrate transfers and ambulation with gross min guard assist to supervision for safety and RW for support. Pt was educated on precautions, brace application/wearing schedule, appropriate activity progression, and car transfer. Pt currently with functional limitations due to the deficits listed below (see PT Problem List). Pt will benefit from skilled PT to increase their independence and safety with mobility to allow discharge to the venue listed below.      Follow Up Recommendations Outpatient PT;Supervision/Assistance - 24 hour    Equipment Recommendations  Rolling walker with 5" wheels;3in1 (PT)    Recommendations for Other Services       Precautions / Restrictions Precautions Precautions: Fall;Cervical Precaution Booklet Issued: No Precaution Comments: Needs handout. Pt was cued for precautions during functional mobility. Required Braces or Orthoses: Cervical Brace Cervical Brace: Soft collar Restrictions Weight Bearing Restrictions: No      Mobility  Bed Mobility Overal bed mobility: Needs Assistance Bed Mobility: Rolling;Sidelying to Sit;Sit to Sidelying Rolling: Supervision Sidelying to sit: Supervision     Sit to sidelying: Supervision General bed mobility comments: Inreased time and effort but pt was able to transition to/from EOB without assistance.    Transfers Overall transfer level: Needs assistance Equipment used: Rolling walker (2 wheeled) Transfers: Sit to/from Stand Sit to Stand: Min assist;Min guard         General transfer comment: Min assist progressing to min guard by  end of session. VC's throughout for hand placement on seated surface for safety.  Ambulation/Gait Ambulation/Gait assistance: Min guard Gait Distance (Feet): 180 Feet Assistive device: Rolling walker (2 wheeled) Gait Pattern/deviations: Step-through pattern;Decreased stride length;Trunk flexed Gait velocity: Decreased Gait velocity interpretation: <1.31 ft/sec, indicative of household ambulator General Gait Details: VC's for improved posture, closer walker proximity, and forward gaze. Slow but generally steady gait.  Stairs            Wheelchair Mobility    Modified Rankin (Stroke Patients Only)       Balance Overall balance assessment: Needs assistance Sitting-balance support: Feet supported;No upper extremity supported Sitting balance-Leahy Scale: Fair     Standing balance support: Single extremity supported;During functional activity Standing balance-Leahy Scale: Poor (approaching Fair) Standing balance comment: Required 1 hand assist to maintain standing balance pulling up briefs                             Pertinent Vitals/Pain Pain Assessment: Faces Faces Pain Scale: Hurts even more Pain Location: Incision site, fingertps Pain Descriptors / Indicators: Operative site guarding;Pins and needles Pain Intervention(s): Limited activity within patient's tolerance;Monitored during session;Repositioned    Home Living Family/patient expects to be discharged to:: Private residence Living Arrangements: Spouse/significant other Available Help at Discharge: Family;Available 24 hours/day Type of Home: House Home Access: Level entry     Home Layout: Two level;Laundry or work area in basement;Able to live on main level with bedroom/bathroom Home Equipment: [employer] - single point      Prior Function Level of Independence: Independent         Comments: Pt reports she was independent with ADL's and driving, however had difficulty  fixing her hair.     Hand  Dominance        Extremity/Trunk Assessment   Upper Extremity Assessment Upper Extremity Assessment: Defer to OT evaluation    Lower Extremity Assessment Lower Extremity Assessment: Generalized weakness    Cervical / Trunk Assessment Cervical / Trunk Assessment: Other exceptions Cervical / Trunk Exceptions: s/p surgery - forward head posture with rounded shoulders  Communication   Communication: No difficulties  Cognition Arousal/Alertness: Awake/alert Behavior During Therapy: WFL for tasks assessed/performed Overall Cognitive Status: Within Functional Limits for tasks assessed                                        General Comments      Exercises     Assessment/Plan    PT Assessment Patient needs continued PT services  PT Problem List Decreased strength;Decreased activity tolerance;Decreased balance;Decreased mobility;Decreased knowledge of use of DME;Decreased safety awareness;Decreased knowledge of precautions;Pain       PT Treatment Interventions DME instruction;Gait training;Functional mobility training;Therapeutic activities;Therapeutic exercise;Neuromuscular re-education;Patient/family education    PT Goals (Current goals can be found in the Care Plan section)  Acute Rehab PT Goals Patient Stated Goal: Home today PT Goal Formulation: With patient Time For Goal Achievement: 10/30/20 Potential to Achieve Goals: Good    Frequency Min 5X/week   Barriers to discharge        Co-evaluation               AM-PAC PT "6 Clicks" Mobility  Outcome Measure Help needed turning from your back to your side while in a flat bed without using bedrails?: A Little Help needed moving from lying on your back to sitting on the side of a flat bed without using bedrails?: A Little Help needed moving to and from a bed to a chair (including a wheelchair)?: A Little Help needed standing up from a chair using your arms (e.g., wheelchair or bedside chair)?: A  Little Help needed to walk in hospital room?: A Little Help needed climbing 3-5 steps with a railing? : A Little 6 Click Score: 18    End of Session Equipment Utilized During Treatment: Gait belt;Cervical collar Activity Tolerance: Patient tolerated treatment well Patient left: in bed;with call bell/phone within reach;with family/visitor present (OT present) Nurse Communication: Mobility status PT Visit Diagnosis: Unsteadiness on feet (R26.81);Pain Pain - part of body:  (neck, fingertips)    Time: 0349-1791 PT Time Calculation (min) (ACUTE ONLY): 33 min   Charges:   PT Evaluation $PT Eval Low Complexity: 1 Low PT Treatments $Gait Training: 8-22 mins        Conni Slipper, PT, DPT Acute Rehabilitation Services Pager: [phone] Office: [phone]   Marylynn Pearson 10/23/2020, 10:51 AM

## 2020-10-23 NOTE — Evaluation (Addendum)
Occupational Therapy Evaluation Patient Details Name: Emily Gallagher MRN: [id number] DOB: 1956/07/05 Today's Date: 10/23/2020    History of Present Illness Pt is a 65 y/o female who presents s/p C3-C7 posterior cervical fusion on 10/22/2020. PMH significant for depression, lung nodules.   Clinical Impression   Patient admitted for above and limited by problem list below, including generalized weakness, pain in B index fingers, cervical precautions, and impaired balance.  Patient currently demonstrates ability to complete bed mobility with supervision, transfers and in room mobility with up to min assist, UB ADLs and LB ADLs with min assist.  Issued built up handles to provide increased grasp support with self feeding and grooming tasks due to index finger pain/edema and decreased ROM, encouraged pt to move hands as tolerated and elevate hands. Educated on brace wear schedule, precautions and compensatory techniques. Pt has good support from spouse and believe she will benefit from continued OT services acutely to optimize independence and tolerance.  No follow up OT services indicated at this time.     Follow Up Recommendations  No OT follow up;Supervision/Assistance - 24 hour    Equipment Recommendations  3 in 1 bedside commode;Other (comment) (RW)    Recommendations for Other Services       Precautions / Restrictions Precautions Precautions: Fall;Cervical Precaution Booklet Issued: No Precaution Comments: needs handout Required Braces or Orthoses: Cervical Brace Cervical Brace: Soft collar Restrictions Weight Bearing Restrictions: No      Mobility Bed Mobility Overal bed mobility: Needs Assistance Bed Mobility: Rolling;Sidelying to Sit;Sit to Sidelying Rolling: Supervision Sidelying to sit: Supervision     Sit to sidelying: Supervision General bed mobility comments: Inreased time and effort but pt was able to transition to/from EOB without assistance.     Transfers Overall transfer level: Needs assistance Equipment used: Rolling walker (2 wheeled) Transfers: Sit to/from Stand Sit to Stand: Min assist         General transfer comment: min assist to power up and steady, cueing for hand pacement and posture    Balance Overall balance assessment: Needs assistance Sitting-balance support: Feet supported;No upper extremity supported Sitting balance-Leahy Scale: Fair     Standing balance support: Bilateral upper extremity supported;During functional activity;No upper extremity supported Standing balance-Leahy Scale: Poor Standing balance comment: relies on UE support                           ADL either performed or assessed with clinical judgement   ADL Overall ADL's : Needs assistance/impaired     Grooming: Min guard;Standing   Upper Body Bathing: Supervision/ safety;Sitting   Lower Body Bathing: Sit to/from stand;Minimal assistance   Upper Body Dressing : Sitting;Minimal assistance   Lower Body Dressing: Minimal assistance;Sit to/from stand   Toilet Transfer: Minimal assistance;Ambulation;RW           Functional mobility during ADLs: Minimal assistance;Rolling walker;Cueing for safety General ADL Comments: educated on compensatory techniques for ADLs and back precuations, min cueing required; increased assist required due to pain in index fingers; issued built up handles for utensils     Vision Baseline Vision/History: Wears glasses       Perception     Praxis      Pertinent Vitals/Pain Pain Assessment: Faces Faces Pain Scale: Hurts even more Pain Location: Incision site, B index fingers Pain Descriptors / Indicators: Operative site guarding;Pins and needles Pain Intervention(s): Limited activity within patient's tolerance;Monitored during session;Repositioned (encouraged elevation and movement of hands/fingers)  Hand Dominance Right   Extremity/Trunk Assessment Upper Extremity  Assessment Upper Extremity Assessment: Overall WFL for tasks assessed (limited dt pain B index fingers, mild edema)   Lower Extremity Assessment Lower Extremity Assessment: Defer to PT evaluation   Cervical / Trunk Assessment Cervical / Trunk Assessment: Other exceptions Cervical / Trunk Exceptions: s/p surgery - forward head posture with rounded shoulders   Communication Communication Communication: No difficulties   Cognition Arousal/Alertness: Awake/alert Behavior During Therapy: WFL for tasks assessed/performed Overall Cognitive Status: Within Functional Limits for tasks assessed                                     General Comments  spouse present and supportive    Exercises     Shoulder Instructions      Home Living Family/patient expects to be discharged to:: Private residence Living Arrangements: Spouse/significant other Available Help at Discharge: Family;Available 24 hours/day Type of Home: House Home Access: Level entry     Home Layout: Two level;Laundry or work area in basement;Able to live on main level with bedroom/bathroom Alternate Teacher, music of Steps: flight   Bathroom Shower/Tub: Chief Strategy Officer: Standard     Home Equipment: [employer] - single point          Prior Functioning/Environment Level of Independence: Independent        Comments: Pt reports she was independent with ADL's and driving, however had difficulty fixing her hair.        OT Problem List: Decreased strength;Decreased activity tolerance;Impaired balance (sitting and/or standing);Decreased coordination;Decreased knowledge of precautions;Impaired sensation;Pain      OT Treatment/Interventions: Self-care/ADL training;Therapeutic exercise;DME and/or AE instruction;Therapeutic activities;Patient/family education;Balance training    OT Goals(Current goals can be found in the care plan section) Acute Rehab OT Goals Patient Stated Goal: less  pain OT Goal Formulation: With patient/family Time For Goal Achievement: 11/06/20 Potential to Achieve Goals: Good  OT Frequency: Min 2X/week   Barriers to D/C:            Co-evaluation              AM-PAC OT "6 Clicks" Daily Activity     Outcome Measure Help from another person eating meals?: A Little Help from another person taking care of personal grooming?: A Little Help from another person toileting, which includes using toliet, bedpan, or urinal?: A Little Help from another person bathing (including washing, rinsing, drying)?: A Little Help from another person to put on and taking off regular upper body clothing?: A Little Help from another person to put on and taking off regular lower body clothing?: A Little 6 Click Score: 18   End of Session Equipment Utilized During Treatment: Gait belt;Rolling walker;Cervical collar Nurse Communication: Mobility status  Activity Tolerance: Patient tolerated treatment well Patient left: in bed;with call bell/phone within reach;with family/visitor present  OT Visit Diagnosis: Other abnormalities of gait and mobility (R26.89);Muscle weakness (generalized) (M62.81);Pain Pain - Right/Left:  (bil) Pain - part of body: Hand                Time: 6237-6283 OT Time Calculation (min): 21 min Charges:  OT General Charges $OT Visit: 1 Visit OT Evaluation $OT Eval Moderate Complexity: 1 Mod  Barry Brunner, OT Acute Rehabilitation Services Pager [phone] Office [phone]   Chancy Milroy 10/23/2020, 11:18 AM

## 2020-10-24 MED ORDER — METHYLPREDNISOLONE 4 MG PO TBPK: ORAL_TABLET | ORAL | 0 refills | Status: AC

## 2020-10-24 MED ORDER — OXYCODONE-ACETAMINOPHEN 5-325 MG PO TABS
1.0000 | ORAL_TABLET | ORAL | 0 refills | Status: AC | PRN
Start: 2020-10-24 — End: 2021-10-24

## 2020-10-24 MED ORDER — HYDROCODONE-ACETAMINOPHEN 5-325 MG PO TABS: 1.0000 | ORAL_TABLET | ORAL | 0 refills | Status: AC | PRN

## 2020-10-24 MED ORDER — METHOCARBAMOL 500 MG PO TABS: 500.0000 mg | ORAL_TABLET | Freq: Four times a day (QID) | ORAL | 0 refills | Status: AC

## 2020-10-24 NOTE — TOC Initial Note (Addendum)
Transition of Care [facility]) - Initial/Assessment Note    Patient Details  Name: Emily Gallagher MRN: [id number] Date of Birth: 09-10-1955  Transition of Care [facility]) CM/SW Contact:    Joanne Chars, LCSW Phone Number: 10/24/2020, 1:04 PM  Clinical Narrative:    CSW met with pt and husband to discuss discharge plan.  Permission given to speak with husband.  They are agreeable to [facility], choice document given, they eventually chose [facility] [place], [place].  PCP in place, [facility] in [place].  No equipment currently in home.  Pt is vaccinated and boosted for covid.  CSW spoke with Baker Janus at [facility], [phone] and she can accept the referral.  Asked for referral to be faxed to [phone], which was done.    1430: CSW called Baker Janus at [facility] to inform that pt discharging today.                Expected Discharge Plan: [place] Barriers to Discharge: Continued Medical Work up   Patient Goals and CMS Choice Patient states their goals for this hospitalization and ongoing recovery are:: being more independant CMS Medicare.gov Compare Post Acute Care list provided to:: Patient Choice offered to / list presented to : Patient  Expected Discharge Plan and Services Expected Discharge Plan: [place] In-house Referral: Clinical Social Work   Post Acute Care Choice: [place] arrangements for the past 2 months: [place]                 DME Arranged:  (DME arranged by floor staff)         HH Arranged: PT,OT [place] Agency: Other - See comment ([facility], [place]) Date HH Agency Contacted: 10/24/20 Time [place]: 1255 Representative spoke with at [place]: Baker Janus  Prior Living Arrangements/Services Living arrangements for the past 2 months: [place] with:: Spouse Patient language and need for interpreter reviewed:: Yes Do you feel safe going back to the place where you live?: Yes       Need for Family Participation in Patient Care: Yes (Comment) Care giver support system in place?: Yes (comment) Current home services: Other (comment) (none) Criminal Activity/Legal Involvement Pertinent to Current Situation/Hospitalization: No - Comment as needed  Activities of Daily Living      Permission Sought/Granted Permission sought to share information with : Family Supports Permission granted to share information with : Yes, Verbal Permission Granted  Share Information with NAME: husband JT  Permission granted to share info w AGENCY: HH        Emotional Assessment Appearance:: Appears stated age Attitude/Demeanor/Rapport: Engaged Affect (typically observed): Appropriate,Pleasant Orientation: : Oriented to Self,Oriented to Place,Oriented to  Time,Oriented to Situation Alcohol / Substance Use: Not Applicable Psych Involvement: No (comment)  Admission diagnosis:  Spinal stenosis of cervical region [M48.02] Patient Active Problem List   Diagnosis Date Noted  . Spinal stenosis of cervical region 10/22/2020   PCP:  Pcp, No Pharmacy:   [facility] [place], [place] [address] [place] [id number] Phone: [phone] Fax: [phone]     Social Determinants of Health (SDOH) Interventions    Readmission Risk Interventions No flowsheet data found.

## 2020-10-24 NOTE — Progress Notes (Signed)
Physical Therapy Treatment Patient Details Name: Emily Gallagher MRN: [id number] DOB: 21-Jan-1956 Today's Date: 10/24/2020    History of Present Illness Pt is a 65 y/o female who presents s/p C3-C7 posterior cervical fusion on 10/22/2020. PMH significant for depression, lung nodules.    PT Comments    Pt progressing with post-op mobility. She was able to demonstrate transfers and ambulation with gross min guard assist to supervision for safety and RW for support. Pt reports fingertips have improved sensation this session. She endorses trying to keep from drinking fluids as to not urinate, and she was educated on the importance of staying hydrated. Pt and husband educated on precautions, brace application/wearing schedule, appropriate activity progression, and car transfer. Will continue to follow.      Follow Up Recommendations  Home health PT;Supervision/Assistance - 24 hour     Equipment Recommendations  Rolling walker with 5" wheels;3in1 (PT)    Recommendations for Other Services       Precautions / Restrictions Precautions Precautions: Fall;Cervical Precaution Booklet Issued: Yes (comment) Precaution Comments: Handout provided and reviewed. Pt was cued for precautions during functional mobility. Required Braces or Orthoses: Cervical Brace Cervical Brace: Soft collar Restrictions Weight Bearing Restrictions: No    Mobility  Bed Mobility Overal bed mobility: Needs Assistance Bed Mobility: Rolling;Sidelying to Sit;Sit to Sidelying Rolling: Supervision Sidelying to sit: Supervision     Sit to sidelying: Supervision General bed mobility comments: Inreased time and effort but pt was able to transition to/from EOB without assistance. HOB elevated.    Transfers Overall transfer level: Needs assistance Equipment used: Rolling walker (2 wheeled) Transfers: Sit to/from Stand Sit to Stand: Min guard         General transfer comment: Repetition of cues for hand placement  on seated surface for safety. No assist required however hands-on guarding provided throughout for safety.  Ambulation/Gait Ambulation/Gait assistance: Min guard Gait Distance (Feet): 225 Feet Assistive device: Rolling walker (2 wheeled) Gait Pattern/deviations: Step-through pattern;Decreased stride length;Trunk flexed Gait velocity: Decreased Gait velocity interpretation: <1.31 ft/sec, indicative of household ambulator General Gait Details: VC's for improved posture, closer walker proximity, and forward gaze. Slow but generally steady gait.   Stairs             Wheelchair Mobility    Modified Rankin (Stroke Patients Only)       Balance Overall balance assessment: Needs assistance Sitting-balance support: Feet supported;No upper extremity supported Sitting balance-Leahy Scale: Fair     Standing balance support: Bilateral upper extremity supported;During functional activity;No upper extremity supported Standing balance-Leahy Scale: Poor Standing balance comment: relies on UE support                            Cognition Arousal/Alertness: Awake/alert Behavior During Therapy: WFL for tasks assessed/performed Overall Cognitive Status: Within Functional Limits for tasks assessed                                        Exercises      General Comments General comments (skin integrity, edema, etc.): spouse present and supportive      Pertinent Vitals/Pain Pain Assessment: Faces Faces Pain Scale: Hurts little more Pain Location: Incision site, B index fingers Pain Descriptors / Indicators: Operative site guarding;Pins and needles Pain Intervention(s): Limited activity within patient's tolerance;Monitored during session;Repositioned    Home Living  Prior Function            PT Goals (current goals can now be found in the care plan section) Acute Rehab PT Goals Patient Stated Goal: less pain PT Goal  Formulation: With patient Time For Goal Achievement: 10/30/20 Potential to Achieve Goals: Good Progress towards PT goals: Progressing toward goals    Frequency    Min 5X/week      PT Plan Discharge plan needs to be updated    Co-evaluation              AM-PAC PT "6 Clicks" Mobility   Outcome Measure  Help needed turning from your back to your side while in a flat bed without using bedrails?: A Little Help needed moving from lying on your back to sitting on the side of a flat bed without using bedrails?: A Little Help needed moving to and from a bed to a chair (including a wheelchair)?: A Little Help needed standing up from a chair using your arms (e.g., wheelchair or bedside chair)?: A Little Help needed to walk in hospital room?: A Little Help needed climbing 3-5 steps with a railing? : A Little 6 Click Score: 18    End of Session Equipment Utilized During Treatment: Gait belt;Cervical collar Activity Tolerance: Patient tolerated treatment well Patient left: in bed;with call bell/phone within reach;with family/visitor present Nurse Communication: Mobility status PT Visit Diagnosis: Unsteadiness on feet (R26.81);Pain Pain - part of body:  (neck, fingertips)     Time: 3267-1245 PT Time Calculation (min) (ACUTE ONLY): 29 min  Charges:  $Gait Training: 23-37 mins                     Conni Slipper, PT, DPT Acute Rehabilitation Services Pager: [phone] Office: [phone]    Marylynn Pearson 10/24/2020, 9:36 AM

## 2020-10-24 NOTE — Progress Notes (Addendum)
Occupational Therapy Treatment Patient Details Name: Emily Gallagher MRN: [id number] DOB: 1956/07/01 Today's Date: 10/24/2020    History of present illness Pt is a 65 y/o female who presents s/p C3-C7 posterior cervical fusion on 10/22/2020. PMH significant for depression, lung nodules.   OT comments  Patient supine in bed and agreeable to OT. Reports using red foam built up handles for meals, and voicing "I love them".  Issued squeeze ball and theraputty, reviewed exercises and patient demonstrating increased ROM/strength and less pain of index fingers today. Patient completing bed mobility with supervision, transfers and toileting/grooming with min guard today. Perseverates throughout session on finger sensation and requires encouragement throughout session. Continues to require increased time, good recall of precautions and adherence throughout session. Spouse present nd verbalizes understanding with exercises as well. Will follow acutely, updated dc plan to [facility] services.    Follow Up Recommendations  Home health OT;Supervision/Assistance - 24 hour    Equipment Recommendations  3 in 1 bedside commode;Other (comment) (RW)    Recommendations for Other Services      Precautions / Restrictions Precautions Precautions: Fall;Cervical Precaution Booklet Issued: Yes (comment) Precaution Comments: Handout provided and reviewed. Pt was cued for precautions during functional mobility. Required Braces or Orthoses: Cervical Brace Cervical Brace: Soft collar Restrictions Weight Bearing Restrictions: No       Mobility Bed Mobility Overal bed mobility: Needs Assistance Bed Mobility: Rolling;Sidelying to Sit;Sit to Sidelying Rolling: Supervision Sidelying to sit: Supervision     Sit to sidelying: Supervision General bed mobility comments: Inreased time and effort but pt was able to transition to/from EOB without assistance. HOB elevated.    Transfers Overall transfer level: Needs  assistance Equipment used: Rolling walker (2 wheeled) Transfers: Sit to/from Stand Sit to Stand: Min guard         General transfer comment: cueing for hand placement during transition, min guard for safety and increased time    Balance Overall balance assessment: Needs assistance Sitting-balance support: Feet supported;No upper extremity supported Sitting balance-Leahy Scale: Fair     Standing balance support: Bilateral upper extremity supported;During functional activity;No upper extremity supported Standing balance-Leahy Scale: Poor Standing balance comment: relies on UE support                           ADL either performed or assessed with clinical judgement   ADL Overall ADL's : Needs assistance/impaired     Grooming: Min guard;Standing;Wash/dry Electrical engineer Transfer: Min guard;Ambulation;BSC;RW   Toileting- Clothing Manipulation and Hygiene: Supervision/safety;Sit to/from stand       Functional mobility during ADLs: Min guard;Rolling walker;Cueing for safety General ADL Comments: pt remains slow moving and anxious with activity, but  progressing well and adhering to cervical precautions     Vision       Perception     Praxis      Cognition Arousal/Alertness: Awake/alert Behavior During Therapy: Anxious Overall Cognitive Status: Within Functional Limits for tasks assessed                                          Exercises Exercises: Other exercises Other Exercises Other Exercises: provided squeeze ball (x 10 reps each hand pinch and gross grasp), issued tan theraputty and reviewed gross grasp x 10  each hand (verbally reviewed pinch and pull)   Shoulder Instructions       General Comments spouse present and supportive    Pertinent Vitals/ Pain       Pain Assessment: Faces Faces Pain Scale: Hurts little more Pain Location: Incision site, B index fingers Pain Descriptors / Indicators: Operative  site guarding;Pins and needles Pain Intervention(s): Limited activity within patient's tolerance;Monitored during session;Repositioned  Home Living                                          Prior Functioning/Environment              Frequency  Min 2X/week        Progress Toward Goals  OT Goals(current goals can now be found in the care plan section)  Progress towards OT goals: Progressing toward goals  Acute Rehab OT Goals Patient Stated Goal: less pain OT Goal Formulation: With patient/family  Plan Discharge plan needs to be updated;Frequency remains appropriate    Co-evaluation                 AM-PAC OT "6 Clicks" Daily Activity     Outcome Measure   Help from another person eating meals?: A Little Help from another person taking care of personal grooming?: A Little Help from another person toileting, which includes using toliet, bedpan, or urinal?: A Little Help from another person bathing (including washing, rinsing, drying)?: A Little Help from another person to put on and taking off regular upper body clothing?: A Little Help from another person to put on and taking off regular lower body clothing?: A Little 6 Click Score: 18    End of Session Equipment Utilized During Treatment: Rolling walker;Cervical collar  OT Visit Diagnosis: Other abnormalities of gait and mobility (R26.89);Muscle weakness (generalized) (M62.81);Pain Pain - Right/Left:  (bil) Pain - part of body: Hand (neck)   Activity Tolerance Patient tolerated treatment well   Patient Left in bed;with call bell/phone within reach;with family/visitor present   Nurse Communication Mobility status        Time: 4196-2229 OT Time Calculation (min): 22 min  Charges: OT General Charges $OT Visit: 1 Visit OT Treatments $Self Care/Home Management : 8-22 mins  Barry Brunner, OT Acute Rehabilitation Services Pager [phone] Office [phone]    Chancy Milroy 10/24/2020, 11:16 AM

## 2020-10-24 NOTE — Plan of Care (Signed)
Patient alert and oriented, voiding adequately, MAE well with no difficulty. Incision area cdi with no s/s of infection. Patient discharged home per order. Patient and husband stated understanding of discharge instructions given. Patient has all belongings with her at discharged.Patient has an appointment with Dr. Wynetta Emery in 2 weeks

## 2020-10-24 NOTE — Discharge Summary (Addendum)
Physician Discharge Summary  Patient ID: Emily Gallagher MRN: [id number] DOB/AGE: 1956-04-09 65 y.o.  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    Discharge Diagnoses: same   Discharged Condition: good  Hospital Course: The patient was admitted on 10/22/2020 and taken to the operating room where the patient underwent posterior cervical decmopression and fusion C3-C7. The patient tolerated the procedure well and was taken to the recovery room and then to the floor in stable condition. The hospital course was routine. There were no complications. The wound remained clean dry and intact. Pt had appropriate neck soreness. No complaints of arm pain or new N/T/W. The patient remained afebrile with stable vital signs, and tolerated a regular diet. The patient continued to increase activities, and pain was well controlled with oral pain medications.   Consults: None  Significant Diagnostic Studies:  Results for orders placed or performed during the hospital encounter of 10/22/20  ABO/Rh  Result Value Ref Range   ABO/RH(D)      B POS Performed at [facility], [address], [place], [place] [address]     DG Cervical Spine 2-3 Views  Result Date: 10/22/2020 CLINICAL DATA:  Posterior fusion C3 through C7 EXAM: CERVICAL SPINE - 2-3 VIEW; DG C-ARM 1-60 MIN COMPARISON:  Cervical radiographs 06/19/2020 FINDINGS: AP and lateral C-arm images were obtained the operating room. Sponge is seen in the posterior soft tissues. Posterior screw placement C3 through C7 bilaterally. Posterior rods not in place at this time. IMPRESSION: Posterior screw placement bilaterally C3 through C7. Electronically Signed   By: Marlan Palau M.D.   On: 10/22/2020 10:07   DG C-Arm 1-60 Min  Result Date: 10/22/2020 CLINICAL DATA:  Posterior fusion C3 through C7 EXAM:  CERVICAL SPINE - 2-3 VIEW; DG C-ARM 1-60 MIN COMPARISON:  Cervical radiographs 06/19/2020 FINDINGS: AP and lateral C-arm images were obtained the operating room. Sponge is seen in the posterior soft tissues. Posterior screw placement C3 through C7 bilaterally. Posterior rods not in place at this time. IMPRESSION: Posterior screw placement bilaterally C3 through C7. Electronically Signed   By: Marlan Palau M.D.   On: 10/22/2020 10:07    Antibiotics:  Anti-infectives (From admission, onward)   Start     Dose/Rate Route Frequency Ordered Stop   10/22/20 1600  ceFAZolin (ANCEF) IVPB 2g/100 mL premix        2 g 200 mL/hr over 30 Minutes Intravenous Every 8 hours 10/22/20 1231 10/23/20 0003   10/22/20 0730  ceFAZolin (ANCEF) IVPB 2g/100 mL premix        2 g 200 mL/hr over 30 Minutes Intravenous On call to O.R. 10/22/20 0726 10/22/20 0742      Discharge Exam: Blood pressure (!) 129/58, pulse 90, temperature 98.4 F (36.9 C), temperature source Oral, resp. rate 18, height 5\' 6"  (1.676 m), weight 63.6 kg, SpO2 96 %. Neurologic: Grossly normal Ambulating and voiding well, incision cdi  Discharge Medications:   Allergies as of 10/24/2020      Reactions   Codeine Itching   Sulfa Antibiotics Swelling      Medication List    TAKE these medications   Advil Cold/Sinus 30-200 MG Tabs Generic drug: Pseudoephedrine-Ibuprofen Take 1 tablet by mouth daily as needed (sinus issues).   Black Elderberry 50 MG/5ML Syrp Take 5 mLs by mouth daily as needed (Immunity).   DULoxetine 60 MG capsule Commonly  known as: CYMBALTA Take 60 mg by mouth daily.   estradiol 0.5 MG tablet Commonly known as: ESTRACE Take 0.25 mg by mouth daily.   fluticasone 50 MCG/ACT nasal spray Commonly known as: FLONASE Place 1 spray into both nostrils daily as needed for allergies or rhinitis.   gabapentin 300 MG capsule Commonly known as: NEURONTIN Take 300 mg by mouth 2 (two) times daily.   HYDROcodone-acetaminophen  5-325 MG tablet Commonly known as: NORCO/VICODIN Take 1 tablet by mouth every 4 (four) hours as needed for moderate pain.   LORazepam 1 MG tablet Commonly known as: ATIVAN Take 2.5 mg by mouth at bedtime.   methocarbamol 500 MG tablet Commonly known as: Robaxin Take 1 tablet (500 mg total) by mouth 4 (four) times daily.   milk thistle 175 MG tablet Take 175 mg by mouth 3 (three) times daily with meals.   omeprazole 20 MG capsule Commonly known as: PRILOSEC Take 20 mg by mouth daily as needed (heartburn).   oxyCODONE-acetaminophen 5-325 MG tablet Commonly known as: Percocet Take 1 tablet by mouth every 4 (four) hours as needed for severe pain.   simvastatin 20 MG tablet Commonly known as: ZOCOR Take 20 mg by mouth daily.   ZICAM COLD REMEDY NA Place 1 application into the nose daily as needed (cold symptoms).            Durable Medical Equipment  (From admission, onward)         Start     Ordered   10/24/20 1327  For home use only DME Walker  Once       Question:  Patient needs a walker to treat with the following condition  Answer:  Spinal stenosis in cervical region   10/24/20 1326   10/24/20 1326  For home use only DME 3 n 1  Once        10/24/20 1326          Disposition: home   Final Dx: posterior cervical decompression and fusion C3-C7  Discharge Instructions     Remove dressing in 72 hours   Complete by: As directed    Call MD for:  difficulty breathing, headache or visual disturbances   Complete by: As directed    Call MD for:  hives   Complete by: As directed    Call MD for:  persistant nausea and vomiting   Complete by: As directed    Call MD for:  redness, tenderness, or signs of infection (pain, swelling, redness, odor or green/yellow discharge around incision site)   Complete by: As directed    Call MD for:  severe uncontrolled pain   Complete by: As directed    Call MD for:  temperature >100.4   Complete by: As directed    Diet - low  sodium heart healthy   Complete by: As directed    Driving Restrictions   Complete by: As directed    No driving for 2 weeks, no riding in the car for 1 week   Increase activity slowly   Complete by: As directed    Lifting restrictions   Complete by: As directed    No lifting more than 8 lbs         Signed: Tiana Loft  10/24/2020, 1:50 PM

## 2022-10-08 IMAGING — RF DG C-ARM 1-60 MIN
1 series · 3 of 3 positions shown · non-contrast
Comparison: Cervical radiographs 06/19/2020

CLINICAL DATA: Posterior fusion C3 through C7

EXAM:
CERVICAL SPINE - 2-3 VIEW; DG C-ARM 1-60 MIN

[Series 1: run · 3 of 3 slices shown]
[im 1/3]
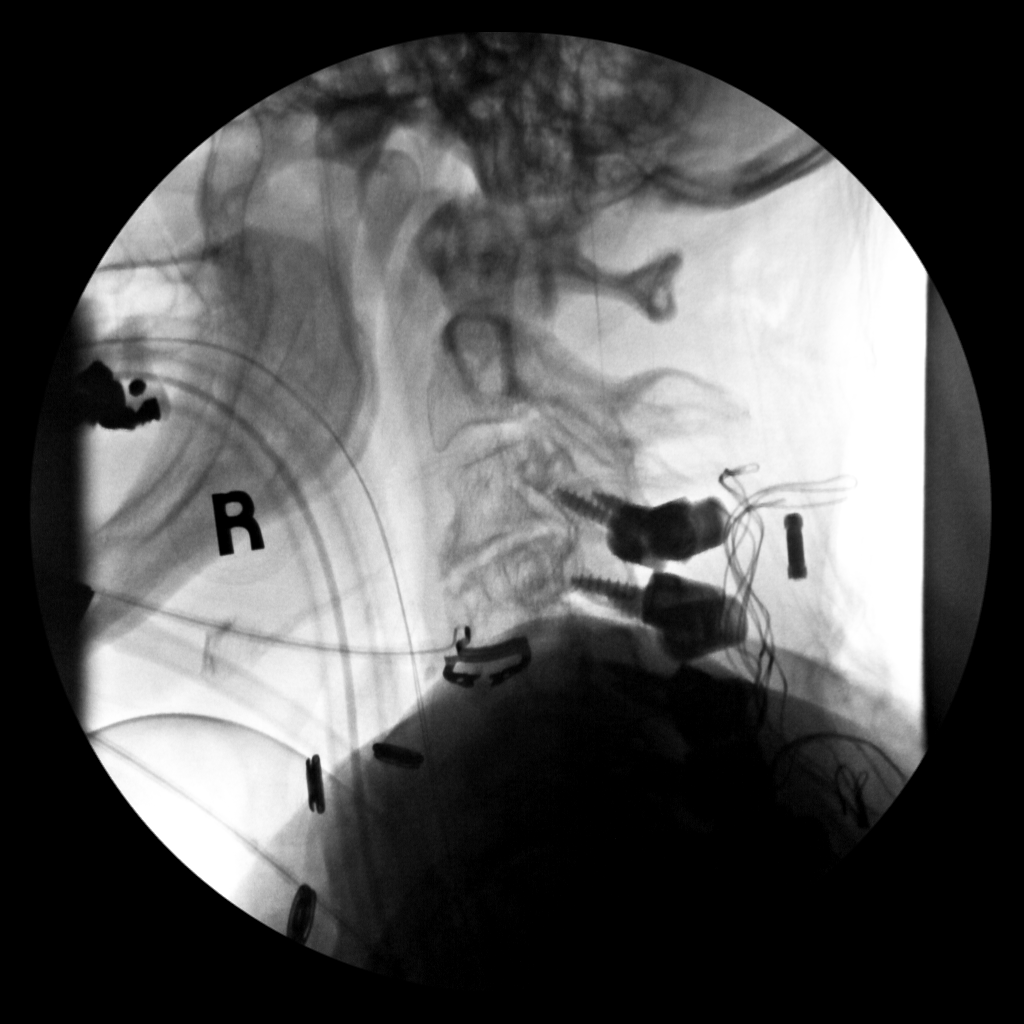
[im 2/3]
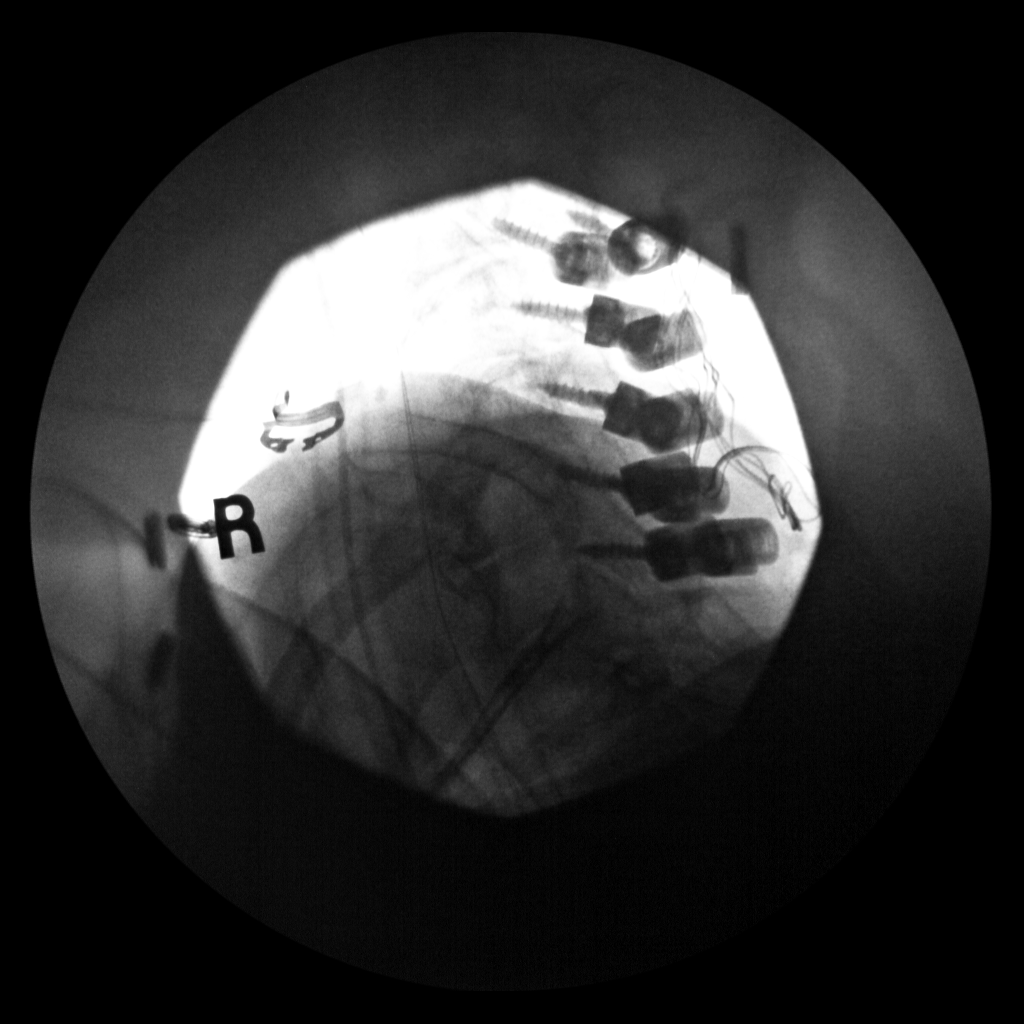
[im 3/3]
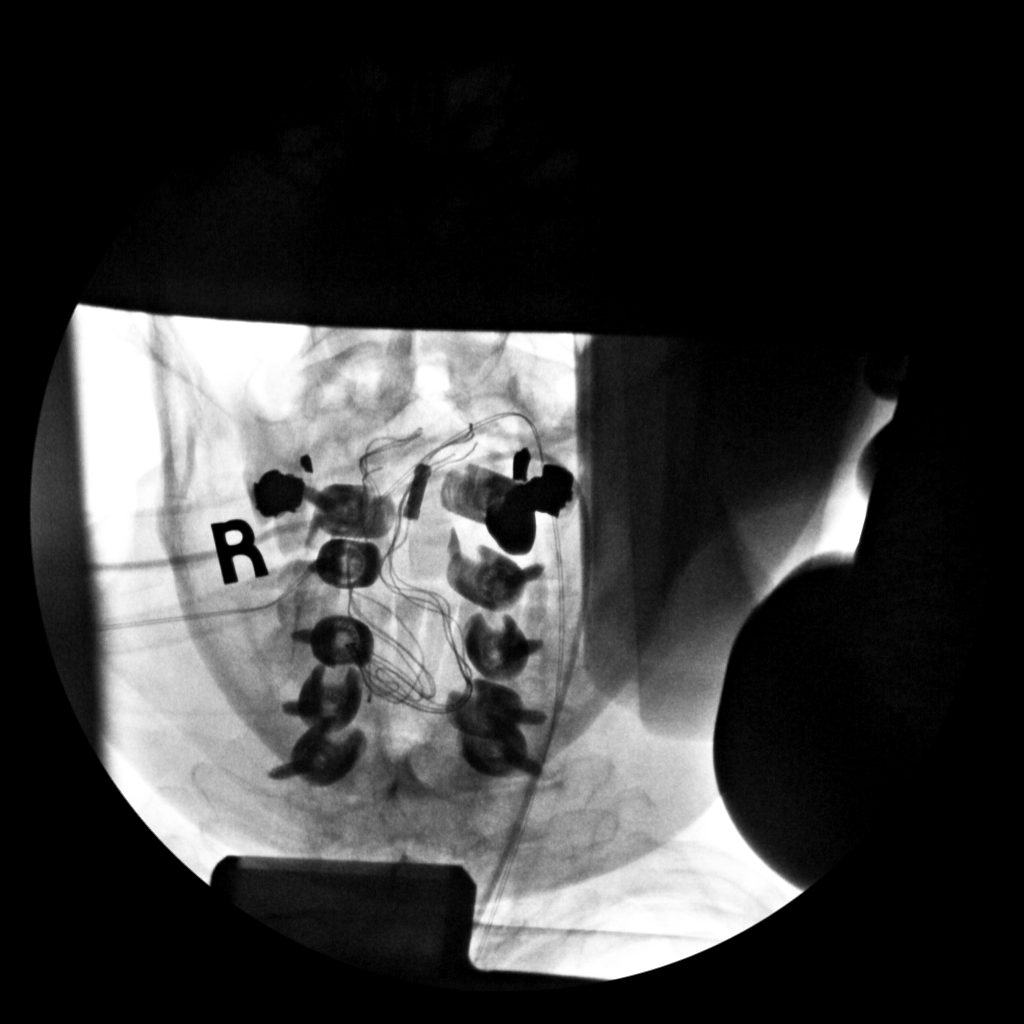

[3 of 3 positions shown; findings below may reference images not displayed]

FINDINGS: AP and lateral C-arm images were obtained the operating room. Sponge
is seen in the posterior soft tissues. Posterior screw placement C3
through C7 bilaterally. Posterior rods not in place at this time.
IMPRESSION: Posterior screw placement bilaterally C3 through C7.
